# Patient Record
Sex: Female | Born: 2000
Health system: Southern US, Community
[De-identification: ages and names within clinical notes are randomized; demographics above are authoritative.]

## PROBLEM LIST (undated history)

## (undated) DIAGNOSIS — J020 Streptococcal pharyngitis: Secondary | ICD-10-CM

## (undated) HISTORY — PX: WISDOM TOOTH EXTRACTION: SHX21

---
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
## 2018-06-08 DIAGNOSIS — Z23 Encounter for immunization: Secondary | ICD-10-CM | POA: Diagnosis not present

## 2018-07-22 DIAGNOSIS — J029 Acute pharyngitis, unspecified: Secondary | ICD-10-CM | POA: Diagnosis not present

## 2018-07-22 DIAGNOSIS — J020 Streptococcal pharyngitis: Secondary | ICD-10-CM | POA: Diagnosis not present

## 2018-09-03 DIAGNOSIS — J020 Streptococcal pharyngitis: Secondary | ICD-10-CM | POA: Diagnosis not present

## 2018-09-03 DIAGNOSIS — M791 Myalgia, unspecified site: Secondary | ICD-10-CM | POA: Diagnosis not present

## 2018-09-03 DIAGNOSIS — J029 Acute pharyngitis, unspecified: Secondary | ICD-10-CM | POA: Diagnosis not present

## 2018-09-27 DIAGNOSIS — J351 Hypertrophy of tonsils: Secondary | ICD-10-CM | POA: Diagnosis not present

## 2018-09-27 DIAGNOSIS — J342 Deviated nasal septum: Secondary | ICD-10-CM | POA: Diagnosis not present

## 2018-09-27 DIAGNOSIS — J358 Other chronic diseases of tonsils and adenoids: Secondary | ICD-10-CM | POA: Diagnosis not present

## 2018-10-01 ENCOUNTER — Other Ambulatory Visit: Payer: Self-pay

## 2018-10-01 ENCOUNTER — Encounter: Payer: Self-pay | Admitting: *Deleted

## 2018-10-07 NOTE — Discharge Instructions (Signed)
T & A INSTRUCTION SHEET - [facility] °[facility] ° °CREIGHTON VAUGHT, MD °PAUL H. JUENGEL, MD  °P. SCOTT BENNETT °CHAPMAN MCQUEEN, MD ° °[address] [place], [place] [address] TEL. [phone] °[address] [place] [address] [phone] ° °INFORMATION SHEET FOR A TONSILLECTOMY AND ADENDOIDECTOMY ° °About Your Tonsils and Adenoids ° The tonsils and adenoids are normal body tissues that are part of our immune system.  They normally help to protect us against diseases that may enter our mouth and nose.  However, sometimes the tonsils and/or adenoids become too large and obstruct our breathing, especially at night. °  ° If either of these things happen it helps to remove the tonsils and adenoids in order to become healthier. The operation to remove the tonsils and adenoids is called a tonsillectomy and adenoidectomy. ° °The Location of Your Tonsils and Adenoids ° The tonsils are located in the back of the throat on both side and sit in a cradle of muscles. The adenoids are located in the roof of the mouth, behind the nose, and closely associated with the opening of the Eustachian tube to the ear. ° °Surgery on Tonsils and Adenoids ° A tonsillectomy and adenoidectomy is a short operation which takes about thirty minutes.  This includes being put to sleep and being awakened.  Tonsillectomies and adenoidectomies are performed at [facility] and may require observation period in the recovery room prior to going home. ° °Following the Operation for a Tonsillectomy ° A cautery machine is used to control bleeding.  Bleeding from a tonsillectomy and adenoidectomy is minimal and postoperatively the risk of bleeding is approximately four percent, although this rarely life threatening. ° ° ° °After your tonsillectomy and adenoidectomy post-op care at home: ° °1. Our patients are able to go home the same day.  You may be given prescriptions for pain  medications and antibiotics, if indicated. °2. It is extremely important to remember that fluid intake is of utmost importance after a tonsillectomy.  The amount that you drink must be maintained in the postoperative period.  A good indication of whether a child is getting enough fluid is whether his/her urine output is constant.  As long as children are urinating or wetting their diaper every 6 - 8 hours this is usually enough fluid intake.   °3. Although rare, this is a risk of some bleeding in the first ten days after surgery.  This is usually occurs between day five and nine postoperatively.  This risk of bleeding is approximately four percent.  If you or your child should have any bleeding you should remain calm and notify our office or go directly to the Emergency Room at [facility] where they will contact us. Our doctors are available seven days a week for notification.  We recommend sitting up quietly in a chair, place an ice pack on the front of the neck and spitting out the blood gently until we are able to contact you.  Adults should gargle gently with ice water and this may help stop the bleeding.  If the bleeding does not stop after a short time, i.e. 10 to 15 minutes, or seems to be increasing again, please contact us or go to the hospital.   °4. It is common for the pain to be worse at 5 - 7 days postoperatively.  This occurs because the “scab” is peeling off and the mucous membrane (skin of   the throat) is growing back where the tonsils were.   °5. It is common for a low-grade fever, less than 102, during the first week after a tonsillectomy and adenoidectomy.  It is usually due to not drinking enough liquids, and we suggest your use liquid Tylenol or the pain medicine with Tylenol prescribed in order to keep your temperature below 102.  Please follow the directions on the back of the bottle. °6. Do not take aspirin or any products that contain aspirin such as Bufferin, Anacin,  Ecotrin, aspirin gum, Goodies, BC headache powders, etc., after a T&A because it can promote bleeding.  Please check with our office before administering any other medication that may been prescribed by other doctors during the two week post-operative period. °7. If you happen to look in the mirror or into your child’s mouth you will see white/gray patches on the back of the throat.  This is what a scab looks like in the mouth and is normal after having a T&A.  It will disappear once the tonsil area heals completely. However, it may cause a noticeable odor, and this too will disappear with time.     °8. You or your child may experience ear pain after having a T&A.  This is called referred pain and comes from the throat, but it is felt in the ears.  Ear pain is quite common and expected.  It will usually go away after ten days.  There is usually nothing wrong with the ears, and it is primarily due to the healing area stimulating the nerve to the ear that runs along the side of the throat.  Use either the prescribed pain medicine or Tylenol as needed.  °9. The throat tissues after a tonsillectomy are obviously sensitive.  Smoking around children who have had a tonsillectomy significantly increases the risk of bleeding.  DO NOT SMOKE!  ° °General Anesthesia, Adult, Care After °This sheet gives you information about how to care for yourself after your procedure. Your health care provider may also give you more specific instructions. If you have problems or questions, contact your health care provider. °What can I expect after the procedure? °After the procedure, the following side effects are common: °· Pain or discomfort at the IV site. °· Nausea. °· Vomiting. °· Sore throat. °· Trouble concentrating. °· Feeling cold or chills. °· Weak or tired. °· Sleepiness and fatigue. °· Soreness and body aches. These side effects can affect parts of the body that were not involved in surgery. °Follow these instructions at  home: ° °For at least 24 hours after the procedure: °· Have a responsible adult stay with you. It is important to have someone help care for you until you are awake and alert. °· Rest as needed. °· Do not: °? Participate in activities in which you could fall or become injured. °? Drive. °? Use heavy machinery. °? Drink alcohol. °? Take sleeping pills or medicines that cause drowsiness. °? Make important decisions or sign legal documents. °? Take care of children on your own. °Eating and drinking °· Follow any instructions from your health care provider about eating or drinking restrictions. °· When you feel hungry, start by eating small amounts of foods that are soft and easy to digest (bland), such as toast. Gradually return to your regular diet. °· Drink enough fluid to keep your urine pale yellow. °· If you vomit, rehydrate by drinking water, juice, or clear broth. °General instructions °· If you have sleep apnea,   surgery and certain medicines can increase your risk for breathing problems. Follow instructions from your health care provider about wearing your sleep device: °? Anytime you are sleeping, including during daytime naps. °? While taking prescription pain medicines, sleeping medicines, or medicines that make you drowsy. °· Return to your normal activities as told by your health care provider. Ask your health care provider what activities are safe for you. °· Take over-the-counter and prescription medicines only as told by your health care provider. °· If you smoke, do not smoke without supervision. °· Keep all follow-up visits as told by your health care provider. This is important. °Contact a health care provider if: °· You have nausea or vomiting that does not get better with medicine. °· You cannot eat or drink without vomiting. °· You have pain that does not get better with medicine. °· You are unable to pass urine. °· You develop a skin rash. °· You have a fever. °· You have redness around your IV  site that gets worse. °Get help right away if: °· You have difficulty breathing. °· You have chest pain. °· You have blood in your urine or stool, or you vomit blood. °Summary °· After the procedure, it is common to have a sore throat or nausea. It is also common to feel tired. °· Have a responsible adult stay with you for the first 24 hours after general anesthesia. It is important to have someone help care for you until you are awake and alert. °· When you feel hungry, start by eating small amounts of foods that are soft and easy to digest (bland), such as toast. Gradually return to your regular diet. °· Drink enough fluid to keep your urine pale yellow. °· Return to your normal activities as told by your health care provider. Ask your health care provider what activities are safe for you. °This information is not intended to replace advice given to you by your health care provider. Make sure you discuss any questions you have with your health care provider. °Document Released: 10/30/2000 Document Revised: 03/09/2017 Document Reviewed: 03/09/2017 °Elsevier Interactive Patient Education © 2019 [employer] ° °

## 2018-10-10 ENCOUNTER — Ambulatory Visit: Payer: 59 | Admitting: Anesthesiology

## 2018-10-10 ENCOUNTER — Encounter
Admission: RE | Disposition: A | Discharge status: Home / Self Care | Payer: Self-pay | Source: Home / Self Care | Attending: Otolaryngology

## 2018-10-10 ENCOUNTER — Ambulatory Visit
Admission: RE | Admit: 2018-10-10 | Discharge: 2018-10-10 | Disposition: A | Discharge status: Home / Self Care | Payer: 59 | Attending: Otolaryngology | Admitting: Otolaryngology

## 2018-10-10 DIAGNOSIS — J351 Hypertrophy of tonsils: Secondary | ICD-10-CM | POA: Diagnosis not present

## 2018-10-10 DIAGNOSIS — J3501 Chronic tonsillitis: Secondary | ICD-10-CM | POA: Diagnosis not present

## 2018-10-10 HISTORY — PX: TONSILLECTOMY: SHX5217

## 2018-10-10 HISTORY — DX: Streptococcal pharyngitis: J02.0

## 2018-10-10 LAB — POCT PREGNANCY, URINE: Preg Test, Ur: NEGATIVE

## 2018-10-10 SURGERY — TONSILLECTOMY
Anesthesia: General | Site: Throat

## 2018-10-10 MED ORDER — DEXAMETHASONE SODIUM PHOSPHATE 4 MG/ML IJ SOLN
INTRAMUSCULAR | Status: DC | PRN
  Administered 2018-10-10: 10 mg via INTRAVENOUS

## 2018-10-10 MED ORDER — MIDAZOLAM HCL 5 MG/5ML IJ SOLN
INTRAMUSCULAR | Status: DC | PRN
  Administered 2018-10-10: 2 mg via INTRAVENOUS

## 2018-10-10 MED ORDER — GLYCOPYRROLATE 0.2 MG/ML IJ SOLN
INTRAMUSCULAR | Status: DC | PRN
  Administered 2018-10-10: 0.1 mg via INTRAVENOUS

## 2018-10-10 MED ORDER — ONDANSETRON HCL 4 MG/2ML IJ SOLN: 4.0000 mg | Freq: Once | INTRAMUSCULAR | Status: DC | PRN

## 2018-10-10 MED ORDER — LIDOCAINE HCL (CARDIAC) PF 100 MG/5ML IV SOSY
PREFILLED_SYRINGE | INTRAVENOUS | Status: DC | PRN
  Administered 2018-10-10: 40 mg via INTRAVENOUS

## 2018-10-10 MED ORDER — LACTATED RINGERS IV SOLN: INTRAVENOUS | Status: DC

## 2018-10-10 MED ORDER — LACTATED RINGERS IV SOLN
INTRAVENOUS | Status: DC
  Administered 2018-10-10: 08:00:00 via INTRAVENOUS

## 2018-10-10 MED ORDER — PROPOFOL 10 MG/ML IV BOLUS
INTRAVENOUS | Status: DC | PRN
  Administered 2018-10-10: 150 mg via INTRAVENOUS

## 2018-10-10 MED ORDER — OXYCODONE HCL 5 MG/5ML PO SOLN
5.0000 mg | Freq: Once | ORAL | Status: AC | PRN
  Administered 2018-10-10: 5 mg via ORAL

## 2018-10-10 MED ORDER — OXYCODONE HCL 5 MG PO TABS: 5.0000 mg | ORAL_TABLET | Freq: Once | ORAL | Status: AC | PRN

## 2018-10-10 MED ORDER — ACETAMINOPHEN 10 MG/ML IV SOLN
1000.0000 mg | Freq: Once | INTRAVENOUS | Status: AC
  Administered 2018-10-10: 1000 mg via INTRAVENOUS

## 2018-10-10 MED ORDER — FENTANYL CITRATE (PF) 100 MCG/2ML IJ SOLN
INTRAMUSCULAR | Status: DC | PRN
  Administered 2018-10-10: 100 ug via INTRAVENOUS

## 2018-10-10 MED ORDER — HYDROCODONE-ACETAMINOPHEN 7.5-325 MG/15ML PO SOLN: 15.0000 mL | Freq: Four times a day (QID) | ORAL | 0 refills | Status: AC | PRN

## 2018-10-10 MED ORDER — SUCCINYLCHOLINE CHLORIDE 20 MG/ML IJ SOLN
INTRAMUSCULAR | Status: DC | PRN
  Administered 2018-10-10: 80 mg via INTRAVENOUS

## 2018-10-10 MED ORDER — ONDANSETRON HCL 4 MG/2ML IJ SOLN
INTRAMUSCULAR | Status: DC | PRN
  Administered 2018-10-10: 4 mg via INTRAVENOUS

## 2018-10-10 MED ORDER — SCOPOLAMINE 1 MG/3DAYS TD PT72
1.0000 | MEDICATED_PATCH | Freq: Once | TRANSDERMAL | Status: DC
  Administered 2018-10-10: 1.5 mg via TRANSDERMAL

## 2018-10-10 MED ORDER — FENTANYL CITRATE (PF) 100 MCG/2ML IJ SOLN
25.0000 ug | INTRAMUSCULAR | Status: DC | PRN
  Administered 2018-10-10: 25 ug via INTRAVENOUS

## 2018-10-10 SURGICAL SUPPLY — 13 items
BLADE BOVIE TIP EXT 4 (BLADE) ×2 IMPLANT
CANISTER SUCT 1200ML W/VALVE (MISCELLANEOUS) ×2 IMPLANT
ELECT REM PT RETURN 9FT ADLT (ELECTROSURGICAL) ×2
ELECTRODE REM PT RTRN 9FT ADLT (ELECTROSURGICAL) ×1 IMPLANT
GLOVE PI ULTRA LF STRL 7.5 (GLOVE) ×1 IMPLANT
GLOVE PI ULTRA NON LATEX 7.5 (GLOVE) ×1
KIT TURNOVER KIT A (KITS) ×2 IMPLANT
PACK TONSIL AND ADENOID CUSTOM (PACKS) ×2 IMPLANT
PENCIL SMOKE EVACUATOR (MISCELLANEOUS) ×2 IMPLANT
SLEEVE SUCTION 125 (MISCELLANEOUS) ×2 IMPLANT
SOL ANTI-FOG 6CC FOG-OUT (MISCELLANEOUS) ×1 IMPLANT
SOL FOG-OUT ANTI-FOG 6CC (MISCELLANEOUS) ×1
STRAP BODY AND KNEE 60X3 (MISCELLANEOUS) ×2 IMPLANT

## 2018-10-10 NOTE — Anesthesia Procedure Notes (Signed)
Procedure Name: Intubation Date/Time: 10/10/2018 9:25 AM Performed by: Jimmy Picket, CRNA Pre-anesthesia Checklist: Patient identified, Emergency Drugs available, Suction available, Patient being monitored and Timeout performed Patient Re-evaluated:Patient Re-evaluated prior to induction Oxygen Delivery Method: Circle system utilized Preoxygenation: Pre-oxygenation with 100% oxygen Induction Type: IV induction Ventilation: Mask ventilation without difficulty Laryngoscope Size: Miller and 2 Grade View: Grade I Tube type: Oral Rae Tube size: 7.0 mm Number of attempts: 1 Placement Confirmation: ETT inserted through vocal cords under direct vision,  positive ETCO2 and breath sounds checked- equal and bilateral Tube secured with: Tape Dental Injury: Teeth and Oropharynx as per pre-operative assessment

## 2018-10-10 NOTE — Op Note (Signed)
10/10/2018  9:53 AM    Danielle Luna  [id number]   Pre-Op Dx: Chronic tonsillitis  Post-op Dx: Chronic tonsillitis  Proc: Tonsillectomy  Surg:  Danielle Luna Keri Tavella  Anes:  GOT  EBL: 15 mL  Comp: None  Findings: Very large cryptic tonsils that were scarred down to the tonsillar bed   Procedure: The patient is brought to the operating room placed in supine position.  She is given general anesthesia by oral endotracheal intubation.  A Vernelle Emerald was used to visualize the oropharynx.  The soft palate is retracted and the adenoids are not enlarged at all.  The tonsils are noted to be very large and cryptic where there is debris hiding in the tonsils.  The left tonsil is grasped first with a curved Allis and pulled medially.  The anterior pillar was incised with electrocautery.  The tonsil is dissected from its fossa using blunt dissection and electrocautery.  Bleeding was controlled with direct pressure and electrocautery.  The right tonsil is then removed in a similar fashion.  There is no significant bleeding noted on either side.  The tonsillar beds are now dry.  A soft suction catheter was passed through the esophagus into the stomach and there is no residual left in the stomach.  Patient tolerated the procedure well.  Patient was awakened and taken to recovery room in satisfactory condition.  There were no operative complications.  Dispo:   To PACU to be discharged home  Plan: To follow-up in the office in 2 to 3 weeks for reevaluation make sure she is healed well.  She will use some Tylenol with codeine liquid for pain or just plain Tylenol.  She will push liquids and stay on a soft diet to keep her nutrition up.  Danielle Luna Lyda Colcord  10/10/2018 9:53 AM

## 2018-10-10 NOTE — H&P (Signed)
H&P has been reviewed and patient reevaluated, no changes necessary. To be downloaded later.  

## 2018-10-10 NOTE — Anesthesia Postprocedure Evaluation (Signed)
Anesthesia Post Note  Patient: Danielle Luna  Procedure(s) Performed: TONSILLECTOMY (N/A Throat)  Patient location during evaluation: PACU Anesthesia Type: General Level of consciousness: awake and alert and oriented Pain management: satisfactory to patient Vital Signs Assessment: post-procedure vital signs reviewed and stable Respiratory status: spontaneous breathing, nonlabored ventilation and respiratory function stable Cardiovascular status: blood pressure returned to baseline and stable Postop Assessment: Adequate PO intake and No signs of nausea or vomiting Anesthetic complications: no    Cherly Beach

## 2018-10-10 NOTE — Anesthesia Preprocedure Evaluation (Signed)
Anesthesia Evaluation  Patient identified by MRN, date of birth, ID band Patient awake    Reviewed: Allergy & Precautions, H&P , NPO status , Patient's Chart, lab work & pertinent test results  Airway Mallampati: II  TM Distance: >3 FB Neck ROM: full    Dental no notable dental hx.    Pulmonary    Pulmonary exam normal breath sounds clear to auscultation       Cardiovascular Normal cardiovascular exam Rhythm:regular Rate:Normal     Neuro/Psych    GI/Hepatic   Endo/Other    Renal/GU      Musculoskeletal   Abdominal   Peds  Hematology   Anesthesia Other Findings   Reproductive/Obstetrics                             Anesthesia Physical Anesthesia Plan  ASA: I  Anesthesia Plan: General ETT   Post-op Pain Management:    Induction:   PONV Risk Score and Plan: 3 and Scopolamine patch - Pre-op, Ondansetron and Dexamethasone  Airway Management Planned:   Additional Equipment:   Intra-op Plan:   Post-operative Plan:   Informed Consent: I have reviewed the patients History and Physical, chart, labs and discussed the procedure including the risks, benefits and alternatives for the proposed anesthesia with the patient or authorized representative who has indicated his/her understanding and acceptance.       Plan Discussed with: CRNA  Anesthesia Plan Comments:         Anesthesia Quick Evaluation

## 2018-10-10 NOTE — Transfer of Care (Signed)
Immediate Anesthesia Transfer of Care Note  Patient: Danielle Luna  Procedure(s) Performed: TONSILLECTOMY (N/A Throat)  Patient Location: PACU  Anesthesia Type: General ETT  Level of Consciousness: awake, alert  and patient cooperative  Airway and Oxygen Therapy: Patient Spontanous Breathing and Patient connected to supplemental oxygen  Post-op Assessment: Post-op Vital signs reviewed, Patient's Cardiovascular Status Stable, Respiratory Function Stable, Patent Airway and No signs of Nausea or vomiting  Post-op Vital Signs: Reviewed and stable  Complications: No apparent anesthesia complications

## 2018-10-11 ENCOUNTER — Encounter: Payer: Self-pay | Admitting: Otolaryngology

## 2018-10-14 LAB — SURGICAL PATHOLOGY
